# Patient Record
Sex: Female | Born: 1970 | Race: White | Hispanic: No | Marital: Married | Smoking: Never smoker
Health system: Southern US, Community
[De-identification: ages and names within clinical notes are randomized; demographics above are authoritative.]

## PROBLEM LIST (undated history)

## (undated) DIAGNOSIS — J189 Pneumonia, unspecified organism: Secondary | ICD-10-CM

## (undated) DIAGNOSIS — E079 Disorder of thyroid, unspecified: Secondary | ICD-10-CM

## (undated) DIAGNOSIS — G2581 Restless legs syndrome: Secondary | ICD-10-CM

## (undated) HISTORY — PX: HERNIA REPAIR: SHX51

## (undated) HISTORY — PX: TONSILLECTOMY: SUR1361

## (undated) HISTORY — PX: THYROIDECTOMY: SHX17

---

## 2011-11-11 ENCOUNTER — Other Ambulatory Visit: Payer: Self-pay | Admitting: Family Medicine

## 2011-11-11 DIAGNOSIS — Z1231 Encounter for screening mammogram for malignant neoplasm of breast: Secondary | ICD-10-CM

## 2011-11-17 ENCOUNTER — Ambulatory Visit
Admission: RE | Admit: 2011-11-17 | Discharge: 2011-11-17 | Disposition: A | Discharge status: Home / Self Care | Payer: 59 | Source: Ambulatory Visit | Attending: Family Medicine | Admitting: Family Medicine

## 2011-11-17 DIAGNOSIS — Z1231 Encounter for screening mammogram for malignant neoplasm of breast: Secondary | ICD-10-CM

## 2013-11-15 ENCOUNTER — Emergency Department (HOSPITAL_BASED_OUTPATIENT_CLINIC_OR_DEPARTMENT_OTHER): Payer: Managed Care, Other (non HMO)

## 2013-11-15 ENCOUNTER — Encounter (HOSPITAL_BASED_OUTPATIENT_CLINIC_OR_DEPARTMENT_OTHER): Payer: Self-pay | Admitting: Emergency Medicine

## 2013-11-15 ENCOUNTER — Emergency Department (HOSPITAL_BASED_OUTPATIENT_CLINIC_OR_DEPARTMENT_OTHER)
Admission: EM | Admit: 2013-11-15 | Discharge: 2013-11-15 | Disposition: A | Discharge status: Home / Self Care | Payer: Managed Care, Other (non HMO) | Attending: Emergency Medicine | Admitting: Emergency Medicine

## 2013-11-15 DIAGNOSIS — R000 Tachycardia, unspecified: Secondary | ICD-10-CM | POA: Insufficient documentation

## 2013-11-15 DIAGNOSIS — R1011 Right upper quadrant pain: Secondary | ICD-10-CM | POA: Diagnosis not present

## 2013-11-15 DIAGNOSIS — Z8701 Personal history of pneumonia (recurrent): Secondary | ICD-10-CM | POA: Insufficient documentation

## 2013-11-15 DIAGNOSIS — Z79899 Other long term (current) drug therapy: Secondary | ICD-10-CM | POA: Diagnosis not present

## 2013-11-15 DIAGNOSIS — R05 Cough: Secondary | ICD-10-CM | POA: Insufficient documentation

## 2013-11-15 DIAGNOSIS — R110 Nausea: Secondary | ICD-10-CM | POA: Insufficient documentation

## 2013-11-15 DIAGNOSIS — F419 Anxiety disorder, unspecified: Secondary | ICD-10-CM

## 2013-11-15 DIAGNOSIS — M940 Chondrocostal junction syndrome [Tietze]: Secondary | ICD-10-CM | POA: Diagnosis not present

## 2013-11-15 DIAGNOSIS — F411 Generalized anxiety disorder: Secondary | ICD-10-CM | POA: Insufficient documentation

## 2013-11-15 DIAGNOSIS — R1013 Epigastric pain: Secondary | ICD-10-CM | POA: Diagnosis not present

## 2013-11-15 DIAGNOSIS — R059 Cough, unspecified: Secondary | ICD-10-CM | POA: Insufficient documentation

## 2013-11-15 DIAGNOSIS — E079 Disorder of thyroid, unspecified: Secondary | ICD-10-CM | POA: Insufficient documentation

## 2013-11-15 DIAGNOSIS — Z9089 Acquired absence of other organs: Secondary | ICD-10-CM | POA: Diagnosis not present

## 2013-11-15 DIAGNOSIS — Z8669 Personal history of other diseases of the nervous system and sense organs: Secondary | ICD-10-CM | POA: Insufficient documentation

## 2013-11-15 HISTORY — DX: Restless legs syndrome: G25.81

## 2013-11-15 HISTORY — DX: Pneumonia, unspecified organism: J18.9

## 2013-11-15 HISTORY — DX: Disorder of thyroid, unspecified: E07.9

## 2013-11-15 LAB — COMPREHENSIVE METABOLIC PANEL
ALT: 19 U/L (ref 0–35)
AST: 22 U/L (ref 0–37)
Albumin: 3.4 g/dL — ABNORMAL LOW (ref 3.5–5.2)
Alkaline Phosphatase: 72 U/L (ref 39–117)
Anion gap: 14 (ref 5–15)
BILIRUBIN TOTAL: 0.5 mg/dL (ref 0.3–1.2)
BUN: 17 mg/dL (ref 6–23)
CO2: 24 meq/L (ref 19–32)
Calcium: 9.3 mg/dL (ref 8.4–10.5)
Chloride: 102 mEq/L (ref 96–112)
Creatinine, Ser: 1.1 mg/dL (ref 0.50–1.10)
GFR calc Af Amer: 70 mL/min — ABNORMAL LOW (ref >90)
GFR, EST NON AFRICAN AMERICAN: 61 mL/min — AB (ref >90)
Glucose, Bld: 109 mg/dL — ABNORMAL HIGH (ref 70–99)
Potassium: 4.5 mEq/L (ref 3.7–5.3)
SODIUM: 140 meq/L (ref 137–147)
Total Protein: 7.2 g/dL (ref 6.0–8.3)

## 2013-11-15 LAB — CBC
HCT: 38.3 % (ref 36.0–46.0)
Hemoglobin: 12.8 g/dL (ref 12.0–15.0)
MCH: 27.1 pg (ref 26.0–34.0)
MCHC: 33.4 g/dL (ref 30.0–36.0)
MCV: 81 fL (ref 78.0–100.0)
PLATELETS: 233 10*3/uL (ref 150–400)
RBC: 4.73 MIL/uL (ref 3.87–5.11)
RDW: 15.1 % (ref 11.5–15.5)
WBC: 9.2 10*3/uL (ref 4.0–10.5)

## 2013-11-15 LAB — D-DIMER, QUANTITATIVE: D-Dimer, Quant: <0.27 ug/mL-FEU (ref 0.00–0.48)

## 2013-11-15 LAB — TROPONIN I: Troponin I: <0.3 ng/mL (ref <0.30)

## 2013-11-15 LAB — LIPASE, BLOOD: Lipase: 23 U/L (ref 11–59)

## 2013-11-15 LAB — PRO B NATRIURETIC PEPTIDE: Pro B Natriuretic peptide (BNP): 26.2 pg/mL (ref 0–125)

## 2013-11-15 MED ORDER — BENZONATATE 100 MG PO CAPS: 100.0000 mg | ORAL_CAPSULE | Freq: Three times a day (TID) | ORAL | Status: AC

## 2013-11-15 MED ORDER — ALPRAZOLAM 0.5 MG PO TABS: 0.5000 mg | ORAL_TABLET | Freq: Three times a day (TID) | ORAL | Status: AC | PRN

## 2013-11-15 MED ORDER — ALBUTEROL SULFATE HFA 108 (90 BASE) MCG/ACT IN AERS
2.0000 | INHALATION_SPRAY | Freq: Once | RESPIRATORY_TRACT | Status: AC
  Administered 2013-11-15: 2 via RESPIRATORY_TRACT
  Filled 2013-11-15: qty 6.7

## 2013-11-15 MED ORDER — HYDROCODONE-ACETAMINOPHEN 5-325 MG PO TABS: 1.0000 | ORAL_TABLET | ORAL | Status: AC | PRN

## 2013-11-15 MED ORDER — LORAZEPAM 1 MG PO TABS
1.0000 mg | ORAL_TABLET | Freq: Once | ORAL | Status: AC
  Administered 2013-11-15: 1 mg via ORAL
  Filled 2013-11-15: qty 1

## 2013-11-15 NOTE — ED Provider Notes (Signed)
CSN: [id number]     Arrival date & time 11/15/13  1551 History   First MD Initiated Contact with Patient 11/15/13 1627     Chief Complaint  Patient presents with  . Cough     (Consider location/radiation/quality/duration/timing/severity/associated sxs/prior Treatment) HPI Comments: Pt is a 43 y/o obese female with a PMHx of restless leg syndrome and hypothyroid who presents to the ED with her wife complaining of continued right sided chest pain x 2 weeks. Pt reports 2 weeks ago she was seen at urgent care and started on doxycycline for "infection", however returned about 1 week later with continued symptoms. At that time she was diagnosed clinically with pneumonia in her right lung as she was told there was "fluid sound on the right side". No CXR. She was started on levaquin and albuterol. Pt reports her symptoms have continued and worsened. Admits to a dry cough. Pain worse with deep inspiration. Admits to associated shortness of breath, worse on exertion with wheezing. Denies calf pain or swelling. No recent long travel or surgeries. Non-smoker. Pt also reports early satiety over the past month with associated upper abdominal pain, worse after eating with associated nausea, no vomiting. States at the onset of illness she had a fever of 101, however this symptom has since subsided.  Patient is a 43 y.o. female presenting with cough. The history is provided by the patient and the spouse.  Cough Associated symptoms: chest pain and shortness of breath     Past Medical History  Diagnosis Date  . Pneumonia   . RLS (restless legs syndrome)   . Thyroid disease    Past Surgical History  Procedure Laterality Date  . Thyroidectomy    . Tonsillectomy    . Hernia repair     No family history on file. History  Substance Use Topics  . Smoking status: Never Smoker   . Smokeless tobacco: Not on file  . Alcohol Use: No   OB History   Grav Para Term Preterm Abortions TAB SAB Ect Mult Living              Review of Systems  Respiratory: Positive for cough and shortness of breath.   Cardiovascular: Positive for chest pain.  Gastrointestinal: Positive for nausea and abdominal pain.  All other systems reviewed and are negative.     Allergies  Review of patient's allergies indicates no known allergies.  Home Medications   Prior to Admission medications   Medication Sig Start Date End Date Taking? Authorizing Provider  Levothyroxine Sodium (SYNTHROID PO) Take by mouth.   Yes Historical Provider, MD  ROPINIRole HCl (REQUIP PO) Take by mouth.   Yes Historical Provider, MD  ALPRAZolam Prudy Feeler) 0.5 MG tablet Take 1 tablet (0.5 mg total) by mouth every 8 (eight) hours as needed for anxiety. 11/15/13   Trevor Mace, PA-C  benzonatate (TESSALON) 100 MG capsule Take 1 capsule (100 mg total) by mouth every 8 (eight) hours. 11/15/13   Trevor Mace, PA-C  HYDROcodone-acetaminophen (NORCO/VICODIN) 5-325 MG per tablet Take 1-2 tablets by mouth every 4 (four) hours as needed. 11/15/13   Trevor Mace, PA-C   BP 144/98  Pulse 110  Temp(Src) 98.2 F (36.8 C) (Oral)  Resp 20  Ht  (1.6 m)  Wt 270 lb (122.471 kg)  BMI 47.84 kg/m2  SpO2 97%  LMP 11/01/2013 Physical Exam  Nursing note and vitals reviewed. Constitutional: [id number] to person, place, and time. She appears well-developed and well-nourished.  No distress.  HENT:  Head: Normocephalic and atraumatic.  Mouth/Throat: Oropharynx is clear and moist.  Eyes: Conjunctivae and EOM are normal. Pupils are equal, round, and reactive to light.  Neck: Normal range of motion. Neck supple. No JVD present.  Cardiovascular: Normal rate, normal heart sounds and intact distal pulses.   Tachycardia. No extremity edema.  Pulmonary/Chest: Effort normal and breath sounds normal. No respiratory distress. She has no wheezes. She has no rales. She exhibits no tenderness.  Abdominal: Soft. Normal appearance and bowel sounds are normal. She  exhibits no distension. There is tenderness in the right upper quadrant and epigastric area. There is no rigidity, no rebound, no guarding and negative Murphy's sign.  No peritoneal signs.  Musculoskeletal: Normal range of motion. She exhibits no edema.  Neurological: She is alert and oriented to person, place, and time. She has normal strength. No sensory deficit.  Speech fluent, goal oriented. Moves limbs without ataxia. Equal grip strength bilateral.  Skin: Skin is warm and dry. She is not diaphoretic.  Psychiatric: She has a normal mood and affect. Her behavior is normal.    ED Course  Procedures (including critical care time) Labs Review Labs Reviewed  COMPREHENSIVE METABOLIC PANEL - Abnormal; Notable for the following:    Glucose, Bld 109 (*)    Albumin 3.4 (*)    GFR calc non Af Amer 61 (*)    GFR calc Af Amer 70 (*)    All other components within normal limits  CBC  LIPASE, BLOOD  D-DIMER, QUANTITATIVE  PRO B NATRIURETIC PEPTIDE  TROPONIN I  I-STAT TROPOININ, ED    Imaging Review Dg Chest 2 View  11/15/2013   CLINICAL DATA:  Cough and shortness of breath.  EXAM: CHEST  2 VIEW  COMPARISON:  None.  FINDINGS: The lungs are clear. Heart size is normal. There is no pneumothorax or pleural effusion.  IMPRESSION: Negative chest.   Electronically Signed   By: Drusilla Kanner M.D.   On: 11/15/2013 16:26     EKG Interpretation   Date/Time:  Thursday November 15 2013 16:04:25 EDT Ventricular Rate:  114 PR Interval:  146 QRS Duration: 70 QT Interval:  328 QTC Calculation: 452 R Axis:   11 Text Interpretation:  Sinus tachycardia Otherwise normal ECG No old  tracing to compare Confirmed by JACUBOWITZ  MD, SAM [phone]) on 11/15/2013  4:54:20 PM      MDM   Final diagnoses:  Anxiety  Costochondritis   Pt presenting with continued pleuritic chest pain despite antibiotic treatment for possible pneumonia. She is non-toxic appearing and in NAD. Afebrile, tachycardic, vitals  otherwise stable. No reproducible pain. CXR negative. Labs without any acute finding. No leukocytosis. D-dimer WNL. BNP WNL. Troponin negative. Doubt cardiac. HEART score 1. On re-evaluation, pt reports she is under a lot of stress and increased anxiety from having two deaths in the family within a one week period. After receiving Ativan, heart rate improved and patient reports she is feeling much better. Will discharge home with small amount of Xanax and pain medication. Tessalon for cough. She will followup with her PCP. Stable for discharge. Return precautions given. Patient states understanding of treatment care plan and is agreeable.  Trevor Mace, PA-C 11/17/13 [phone]

## 2013-11-15 NOTE — Patient Instructions (Signed)
Instructed patient on the proper use of administering albuterol mdi via aerochamber patient tolerated well 

## 2013-11-15 NOTE — Discharge Instructions (Signed)
Take xanax as directed for severe anxiety. No driving or operating heavy machinery while taking xanax as it may cause drowsiness. Take tessalon as directed for cough. Take Vicodin for severe pain only. No driving or operating heavy machinery while taking vicodin. This medication may cause drowsiness.  Costochondritis Costochondritis, sometimes called Tietze syndrome, is a swelling and irritation (inflammation) of the tissue (cartilage) that connects your ribs with your breastbone (sternum). It causes pain in the chest and rib area. Costochondritis usually goes away on its own over time. It can take up to 6 weeks or longer to get better, especially if you are unable to limit your activities. CAUSES  Some cases of costochondritis have no known cause. Possible causes include:  Injury (trauma).  Exercise or activity such as lifting.  Severe coughing. SIGNS AND SYMPTOMS  Pain and tenderness in the chest and rib area.  Pain that gets worse when coughing or taking deep breaths.  Pain that gets worse with specific movements. DIAGNOSIS  Your health care provider will do a physical exam and ask about your symptoms. Chest X-rays or other tests may be done to rule out other problems. TREATMENT  Costochondritis usually goes away on its own over time. Your health care provider may prescribe medicine to help relieve pain. HOME CARE INSTRUCTIONS   Avoid exhausting physical activity. Try not to strain your ribs during normal activity. This would include any activities using chest, abdominal, and side muscles, especially if heavy weights are used.  Apply ice to the affected area for the first 2 days after the pain begins.  Put ice in a plastic bag.  Place a towel between your skin and the bag.  Leave the ice on for 20 minutes, 2-3 times a day.  Only take over-the-counter or prescription medicines as directed by your health care provider. SEEK MEDICAL CARE IF:  You have redness or swelling at the  rib joints. These are signs of infection.  Your pain does not go away despite rest or medicine. SEEK IMMEDIATE MEDICAL CARE IF:   Your pain increases or you are very uncomfortable.  You have shortness of breath or difficulty breathing.  You cough up blood.  You have worse chest pains, sweating, or vomiting.  You have a fever or persistent symptoms for more than 2-3 days.  You have a fever and your symptoms suddenly get worse. MAKE SURE YOU:   Understand these instructions.  Will watch your condition.  Will get help right away if you are not doing well or get worse. Document Released: 11/18/2004 Document Revised: 11/29/2012 Document Reviewed: 09/12/2012 [facility] Patient Information 2015 [place], [place]. This information is not intended to replace advice given to you by your health care provider. Make sure you discuss any questions you have with your health care provider.  Panic Attacks Panic attacks are sudden, short-livedsurges of severe anxiety, fear, or discomfort. They may occur for no reason when you are relaxed, when you are anxious, or when you are sleeping. Panic attacks may occur for a number of reasons:   Healthy people occasionally have panic attacks in extreme, life-threatening situations, such as war or natural disasters. Normal anxiety is a protective mechanism of the body that helps [place] react to danger (fight or flight response).  Panic attacks are often seen with anxiety disorders, such as panic disorder, social anxiety disorder, generalized anxiety disorder, and phobias. Anxiety disorders cause excessive or uncontrollable anxiety. They may interfere with your relationships or other life activities.  Panic attacks are  sometimes seen with other mental illnesses, such as depression and posttraumatic stress disorder.  Certain medical conditions, prescription medicines, and drugs of abuse can cause panic attacks. SYMPTOMS  Panic attacks start suddenly, peak within 20  minutes, and are accompanied by four or more of the following symptoms:  Pounding heart or fast heart rate (palpitations).  Sweating.  Trembling or shaking.  Shortness of breath or feeling smothered.  Feeling choked.  Chest pain or discomfort.  Nausea or strange feeling in your stomach.  Dizziness, light-headedness, or feeling like you will faint.  Chills or hot flushes.  Numbness or tingling in your lips or hands and feet.  Feeling that things are not real or feeling that you are not yourself.  Fear of losing control or going crazy.  Fear of dying. Some of these symptoms can mimic serious medical conditions. For example, you may think you are having a heart attack. Although panic attacks can be very scary, they are not life threatening. DIAGNOSIS  Panic attacks are diagnosed through an assessment by your health care provider. Your health care provider will ask questions about your symptoms, such as where and when they occurred. Your health care provider will also ask about your medical history and use of alcohol and drugs, including prescription medicines. Your health care provider may order blood tests or other studies to rule out a serious medical condition. Your health care provider may refer you to a mental health professional for further evaluation. TREATMENT   Most healthy people who have one or two panic attacks in an extreme, life-threatening situation will not require treatment.  The treatment for panic attacks associated with anxiety disorders or other mental illness typically involves counseling with a mental health professional, medicine, or a combination of both. Your health care provider will help determine what treatment is best for you.  Panic attacks due to physical illness usually go away with treatment of the illness. If prescription medicine is causing panic attacks, talk with your health care provider about stopping the medicine, decreasing the dose, or  substituting another medicine.  Panic attacks due to alcohol or drug abuse go away with abstinence. Some adults need professional help in order to stop drinking or using drugs. HOME CARE INSTRUCTIONS   Take all medicines as directed by your health care provider.   Schedule and attend follow-up visits as directed by your health care provider. It is important to keep all your appointments. SEEK MEDICAL CARE IF:  You are not able to take your medicines as prescribed.  Your symptoms do not improve or get worse. SEEK IMMEDIATE MEDICAL CARE IF:   You experience panic attack symptoms that are different than your usual symptoms.  You have serious thoughts about hurting yourself or others.  You are taking medicine for panic attacks and have a serious side effect. MAKE SURE YOU:  Understand these instructions.  Will watch your condition.  Will get help right away if you are not doing well or get worse. Document Released: 02/08/2005 Document Revised: 02/13/2013 Document Reviewed: 09/22/2012 [facility] Patient Information 2015 [place], [place]. This information is not intended to replace advice given to you by your health care provider. Make sure you discuss any questions you have with your health care provider.

## 2013-11-15 NOTE — ED Notes (Signed)
Cough for 2 weeks. She has been diagnosed with pneumonia and completed antibiotics. Pain in her right ribs. Feels worse.

## 2013-11-17 NOTE — ED Provider Notes (Signed)
Medical screening examination/treatment/procedure(s) were performed by non-physician practitioner and as supervising physician I was immediately available for consultation/collaboration.   EKG Interpretation   Date/Time:  Thursday November 15 2013 16:04:25 EDT Ventricular Rate:  114 PR Interval:  146 QRS Duration: 70 QT Interval:  328 QTC Calculation: 452 R Axis:   11 Text Interpretation:  Sinus tachycardia Otherwise normal ECG No old  tracing to compare Confirmed by Ethelda Chick  MD, Jonan Seufert [phone]) on 11/15/2013  4:54:20 PM       Doug Sou, MD 11/17/13 [phone]

## 2016-03-12 IMAGING — CR DG CHEST 2V
2 series · 2 of 2 positions shown · non-contrast
Comparison: None.

CLINICAL DATA: Cough and shortness of breath.

EXAM:
CHEST  2 VIEW

[w chest pa]
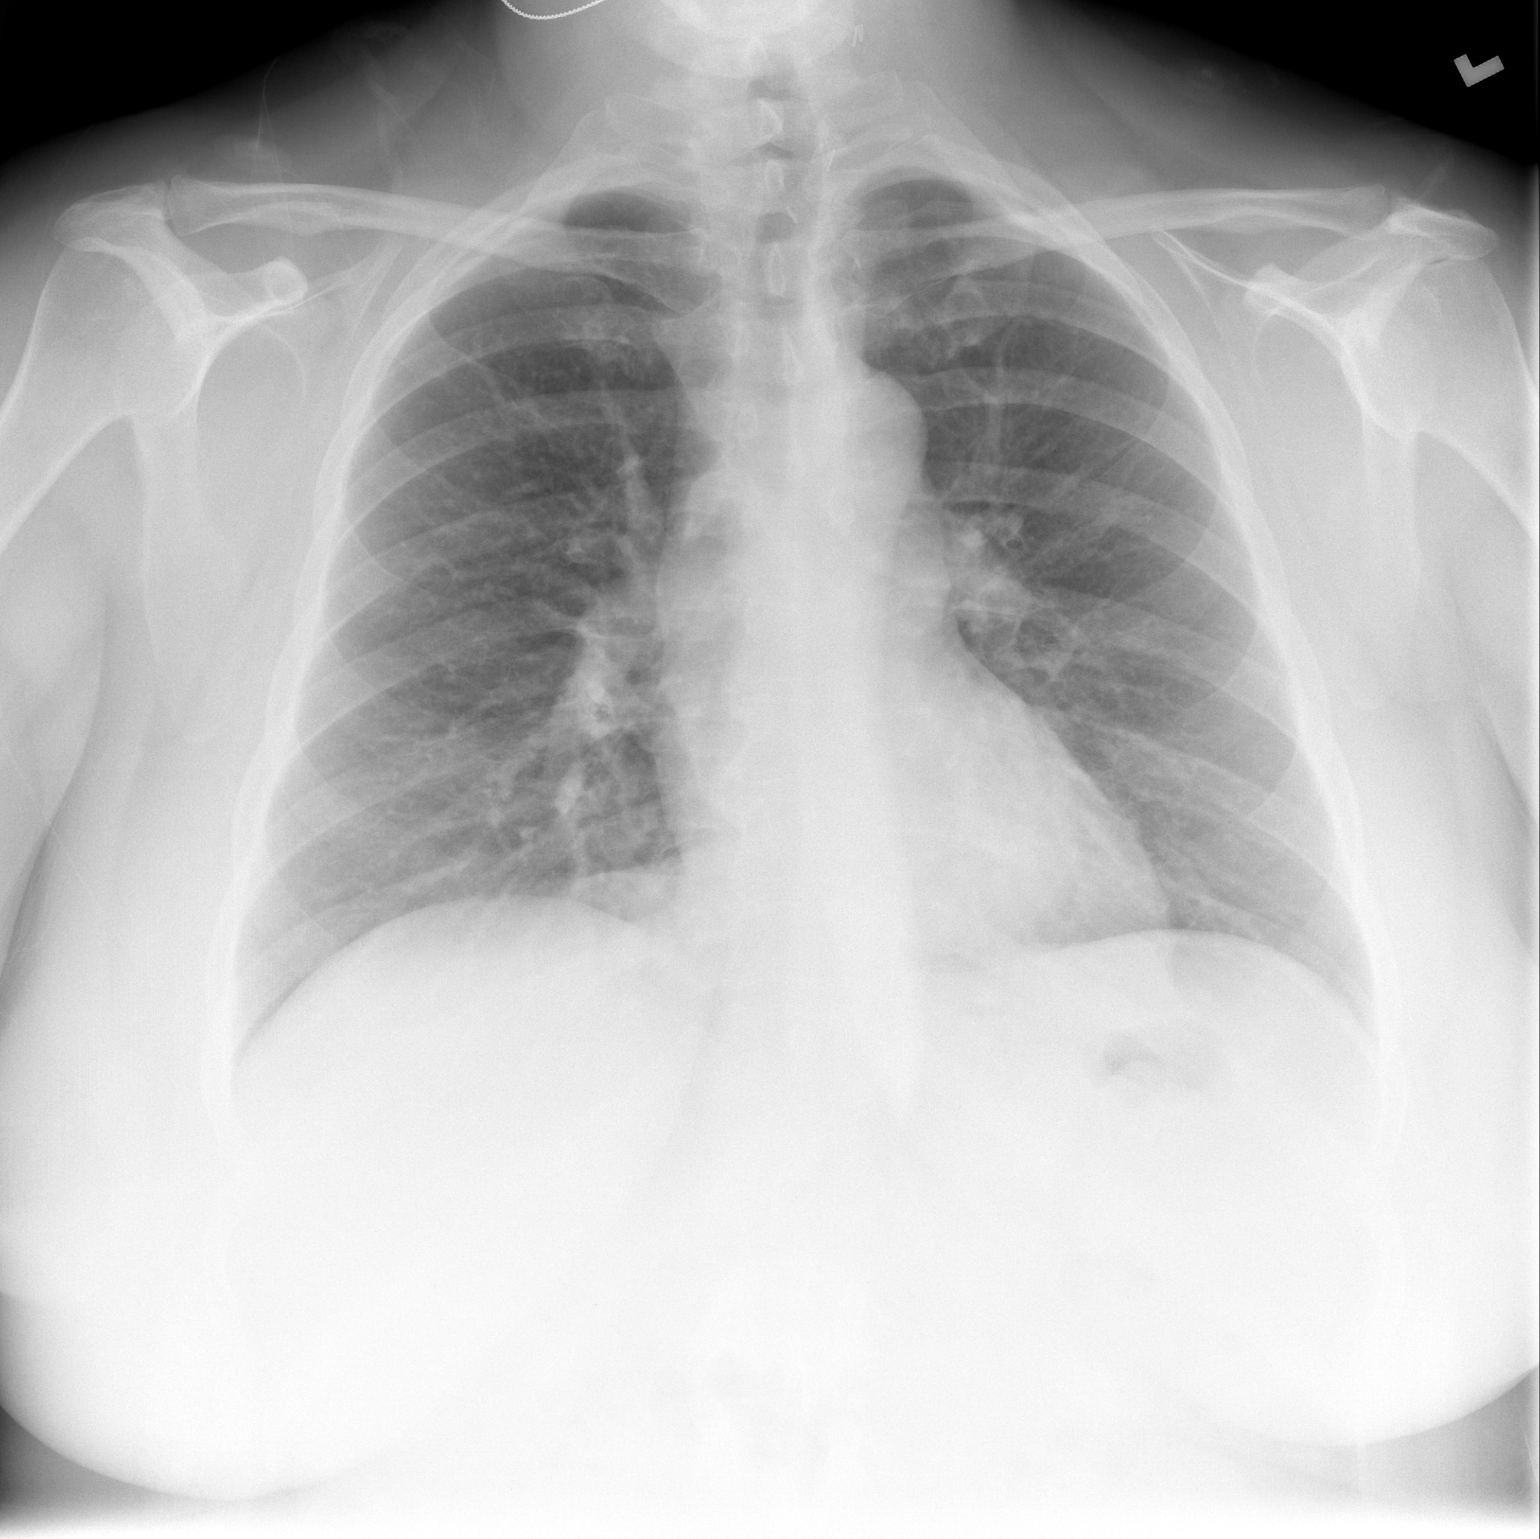

[w chest lat]
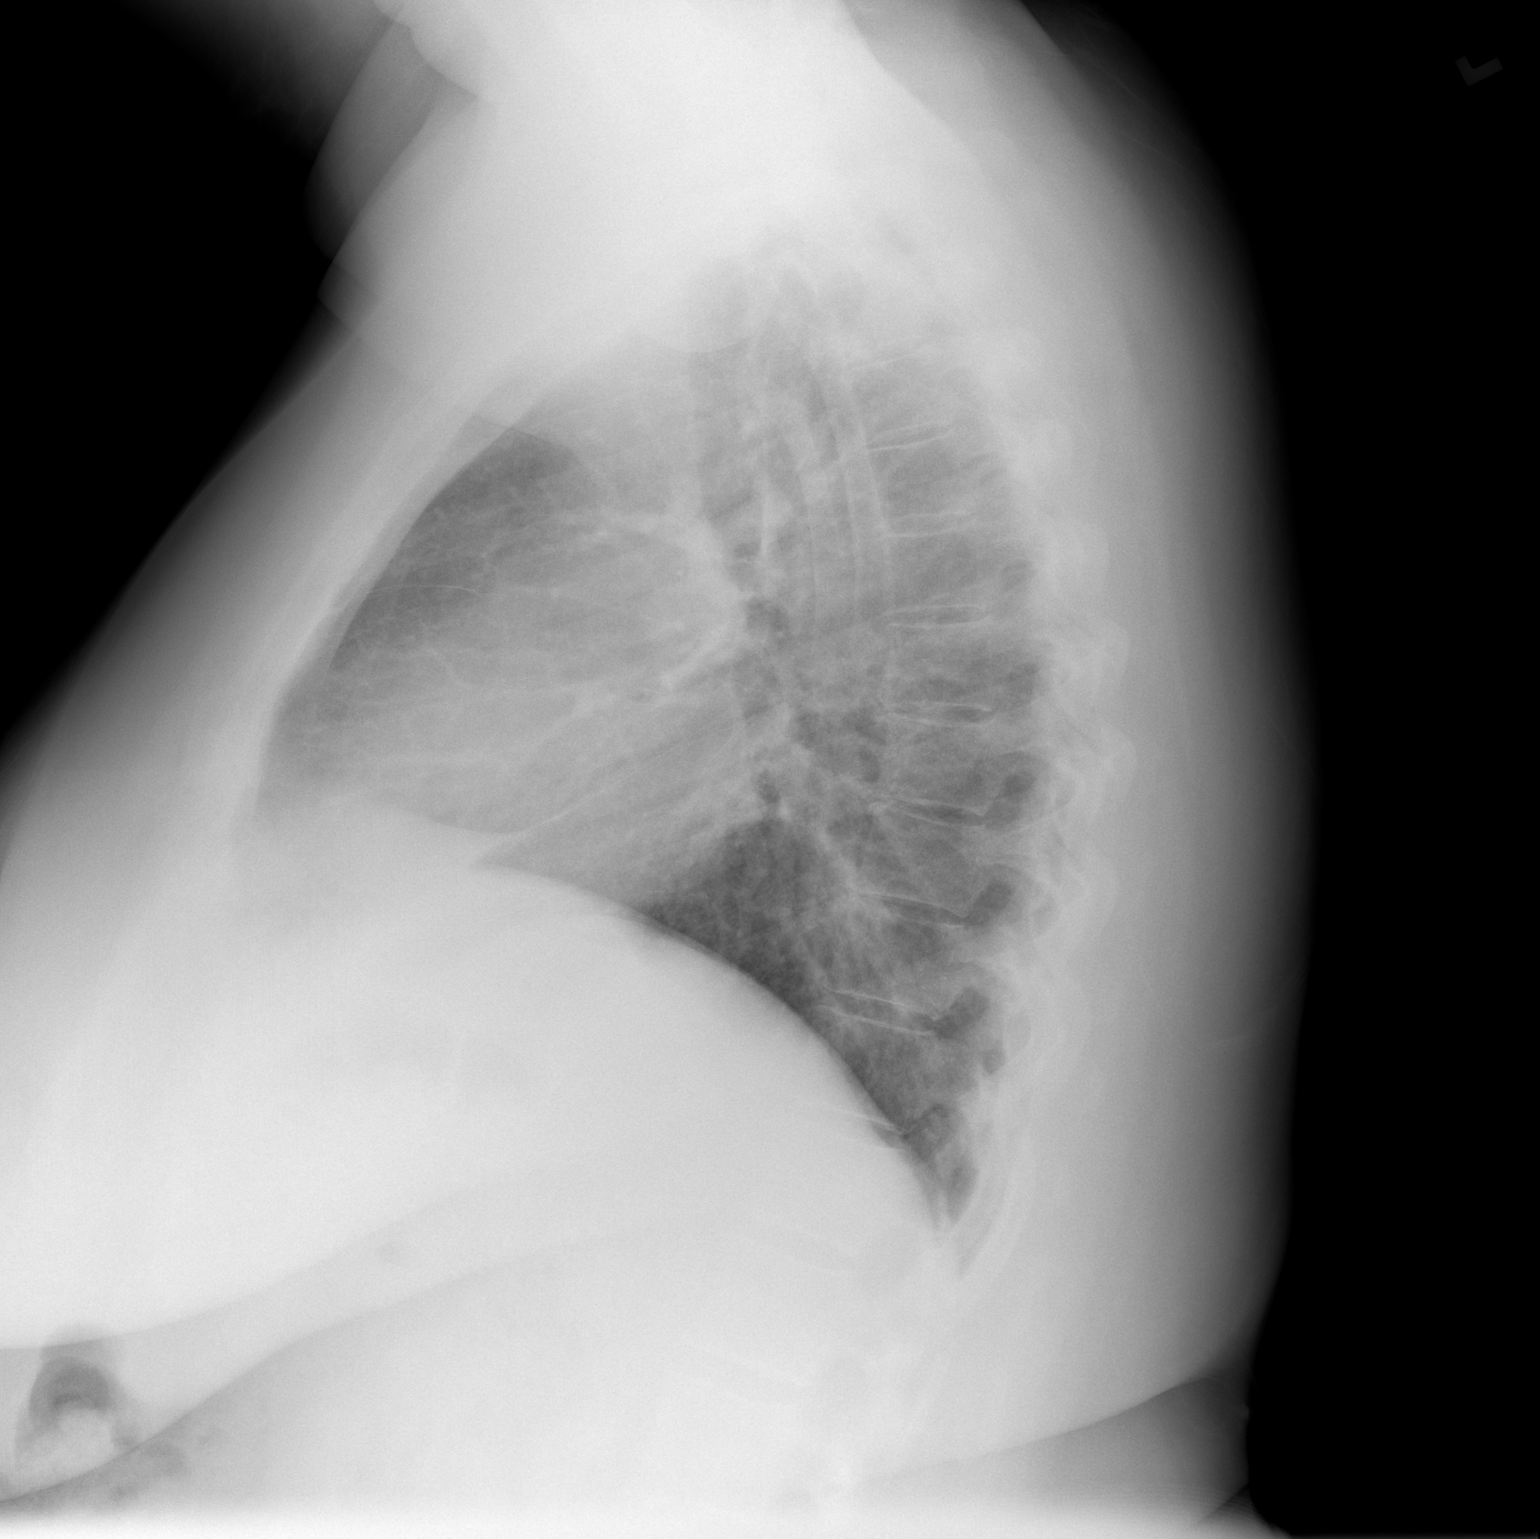

[2 of 2 positions shown; findings below may reference images not displayed]

FINDINGS: The lungs are clear. Heart size is normal. There is no pneumothorax
or pleural effusion.
IMPRESSION: Negative chest.
